# Patient Record
Sex: Male | Born: 1945 | Race: Black or African American | Hispanic: No | Marital: Married | Smoking: Never smoker
Health system: Southern US, Community
[De-identification: ages and names within clinical notes are randomized; demographics above are authoritative.]

## PROBLEM LIST (undated history)

## (undated) DIAGNOSIS — I10 Essential (primary) hypertension: Secondary | ICD-10-CM

## (undated) DIAGNOSIS — C801 Malignant (primary) neoplasm, unspecified: Secondary | ICD-10-CM

## (undated) HISTORY — PX: OTHER SURGICAL HISTORY: SHX169

## (undated) HISTORY — PX: LUNG CANCER SURGERY: SHX702

## (undated) HISTORY — PX: COLON SURGERY: SHX602

---

## 2010-01-13 ENCOUNTER — Ambulatory Visit: Admission: RE | Admit: 2010-01-13 | Discharge: 2010-03-31 | Payer: Self-pay | Admitting: Radiation Oncology

## 2010-01-21 ENCOUNTER — Encounter: Admission: RE | Admit: 2010-01-21 | Discharge: 2010-01-21 | Payer: Self-pay | Admitting: Urology

## 2010-03-01 ENCOUNTER — Ambulatory Visit (HOSPITAL_BASED_OUTPATIENT_CLINIC_OR_DEPARTMENT_OTHER): Admission: RE | Admit: 2010-03-01 | Discharge: 2010-03-01 | Payer: Self-pay | Admitting: Urology

## 2011-01-04 LAB — CBC
Hemoglobin: 15.7 g/dL (ref 13.0–17.0)
MCHC: 32.8 g/dL (ref 30.0–36.0)
MCV: 86.4 fL (ref 78.0–100.0)
Platelets: 255 10*3/uL (ref 150–400)
RBC: 5.53 MIL/uL (ref 4.22–5.81)
RDW: 13.1 % (ref 11.5–15.5)

## 2011-01-04 LAB — COMPREHENSIVE METABOLIC PANEL
ALT: 25 U/L (ref 0–53)
AST: 25 U/L (ref 0–37)
Alkaline Phosphatase: 52 U/L (ref 39–117)
CO2: 27 mEq/L (ref 19–32)
GFR calc non Af Amer: >60 mL/min (ref >60)
Glucose, Bld: 97 mg/dL (ref 70–99)
Total Bilirubin: 0.8 mg/dL (ref 0.3–1.2)
Total Protein: 7.3 g/dL (ref 6.0–8.3)

## 2011-01-04 LAB — PROTIME-INR: Prothrombin Time: 13 seconds (ref 11.6–15.2)

## 2013-02-21 ENCOUNTER — Encounter: Payer: Medicare Other | Admitting: Internal Medicine

## 2013-02-21 ENCOUNTER — Other Ambulatory Visit (HOSPITAL_COMMUNITY): Payer: Self-pay | Admitting: Internal Medicine

## 2013-02-21 DIAGNOSIS — C189 Malignant neoplasm of colon, unspecified: Secondary | ICD-10-CM

## 2013-02-21 DIAGNOSIS — C61 Malignant neoplasm of prostate: Secondary | ICD-10-CM

## 2013-02-21 DIAGNOSIS — E785 Hyperlipidemia, unspecified: Secondary | ICD-10-CM

## 2013-02-21 DIAGNOSIS — G609 Hereditary and idiopathic neuropathy, unspecified: Secondary | ICD-10-CM

## 2013-02-25 ENCOUNTER — Ambulatory Visit (HOSPITAL_COMMUNITY)
Admission: RE | Admit: 2013-02-25 | Discharge: 2013-02-25 | Disposition: A | Discharge status: Home / Self Care | Payer: Medicare Other | Source: Ambulatory Visit | Attending: Internal Medicine | Admitting: Internal Medicine

## 2013-02-25 DIAGNOSIS — C189 Malignant neoplasm of colon, unspecified: Secondary | ICD-10-CM | POA: Insufficient documentation

## 2013-02-25 DIAGNOSIS — Z9049 Acquired absence of other specified parts of digestive tract: Secondary | ICD-10-CM | POA: Insufficient documentation

## 2013-02-25 DIAGNOSIS — I2510 Atherosclerotic heart disease of native coronary artery without angina pectoris: Secondary | ICD-10-CM | POA: Insufficient documentation

## 2013-02-25 DIAGNOSIS — D1779 Benign lipomatous neoplasm of other sites: Secondary | ICD-10-CM | POA: Insufficient documentation

## 2013-02-25 DIAGNOSIS — K7689 Other specified diseases of liver: Secondary | ICD-10-CM | POA: Insufficient documentation

## 2013-02-25 DIAGNOSIS — E279 Disorder of adrenal gland, unspecified: Secondary | ICD-10-CM | POA: Insufficient documentation

## 2013-02-25 LAB — GLUCOSE, CAPILLARY: Glucose-Capillary: 98 mg/dL (ref 70–99)

## 2013-02-25 MED ORDER — FLUDEOXYGLUCOSE F - 18 (FDG) INJECTION
18.0000 | Freq: Once | INTRAVENOUS | Status: AC | PRN
  Administered 2013-02-25: 18 via INTRAVENOUS

## 2013-03-04 ENCOUNTER — Encounter: Payer: Medicare Other | Admitting: Internal Medicine

## 2013-03-04 DIAGNOSIS — C189 Malignant neoplasm of colon, unspecified: Secondary | ICD-10-CM

## 2013-03-04 DIAGNOSIS — C61 Malignant neoplasm of prostate: Secondary | ICD-10-CM

## 2013-09-18 ENCOUNTER — Other Ambulatory Visit (HOSPITAL_COMMUNITY): Payer: Self-pay | Admitting: Internal Medicine

## 2013-09-18 ENCOUNTER — Other Ambulatory Visit (HOSPITAL_COMMUNITY): Payer: Self-pay | Admitting: Hematology and Oncology

## 2013-09-18 DIAGNOSIS — C189 Malignant neoplasm of colon, unspecified: Secondary | ICD-10-CM

## 2013-09-18 DIAGNOSIS — K7689 Other specified diseases of liver: Secondary | ICD-10-CM

## 2013-09-18 DIAGNOSIS — K769 Liver disease, unspecified: Secondary | ICD-10-CM

## 2013-09-19 ENCOUNTER — Other Ambulatory Visit (HOSPITAL_COMMUNITY): Payer: Self-pay | Admitting: Hematology and Oncology

## 2013-09-19 DIAGNOSIS — K769 Liver disease, unspecified: Secondary | ICD-10-CM

## 2013-09-27 ENCOUNTER — Other Ambulatory Visit (HOSPITAL_COMMUNITY): Payer: Self-pay | Admitting: Hematology and Oncology

## 2013-09-27 DIAGNOSIS — R109 Unspecified abdominal pain: Secondary | ICD-10-CM

## 2013-10-02 ENCOUNTER — Encounter (HOSPITAL_COMMUNITY): Payer: Self-pay | Admitting: Pharmacy Technician

## 2013-10-04 ENCOUNTER — Other Ambulatory Visit (HOSPITAL_COMMUNITY): Payer: Self-pay | Admitting: Radiology

## 2013-10-07 ENCOUNTER — Ambulatory Visit (HOSPITAL_COMMUNITY)
Admission: RE | Admit: 2013-10-07 | Discharge: 2013-10-07 | Disposition: A | Discharge status: Home / Self Care | Payer: Medicare Other | Source: Ambulatory Visit | Attending: Hematology and Oncology | Admitting: Hematology and Oncology

## 2013-10-07 DIAGNOSIS — R109 Unspecified abdominal pain: Secondary | ICD-10-CM

## 2013-10-07 DIAGNOSIS — C189 Malignant neoplasm of colon, unspecified: Secondary | ICD-10-CM | POA: Insufficient documentation

## 2013-10-07 DIAGNOSIS — K7689 Other specified diseases of liver: Secondary | ICD-10-CM | POA: Insufficient documentation

## 2013-10-07 LAB — CBC
MCH: 29.2 pg (ref 26.0–34.0)
MCHC: 34.8 g/dL (ref 30.0–36.0)
MCV: 83.9 fL (ref 78.0–100.0)
Platelets: 247 10*3/uL (ref 150–400)

## 2013-10-07 LAB — PROTIME-INR: Prothrombin Time: 13.8 seconds (ref 11.6–15.2)

## 2013-10-07 MED ORDER — SODIUM CHLORIDE 0.9 % IV SOLN
Freq: Once | INTRAVENOUS | Status: AC
  Administered 2013-10-07: 13:00:00 via INTRAVENOUS

## 2013-10-07 MED ORDER — HYDROCODONE-ACETAMINOPHEN 5-325 MG PO TABS: 1.0000 | ORAL_TABLET | ORAL | Status: DC | PRN

## 2013-10-07 MED ORDER — MIDAZOLAM HCL 2 MG/2ML IJ SOLN
INTRAMUSCULAR | Status: AC | PRN
  Administered 2013-10-07: 2 mg via INTRAVENOUS

## 2013-10-07 MED ORDER — MIDAZOLAM HCL 2 MG/2ML IJ SOLN
INTRAMUSCULAR | Status: AC
  Filled 2013-10-07: qty 4

## 2013-10-07 MED ORDER — FENTANYL CITRATE 0.05 MG/ML IJ SOLN
INTRAMUSCULAR | Status: AC | PRN
  Administered 2013-10-07: 25 ug via INTRAVENOUS
  Administered 2013-10-07: 50 ug via INTRAVENOUS

## 2013-10-07 MED ORDER — FENTANYL CITRATE 0.05 MG/ML IJ SOLN
INTRAMUSCULAR | Status: AC
  Filled 2013-10-07: qty 4

## 2013-10-07 NOTE — H&P (Signed)
Andrew Mccann is an 67 y.o. male.   Chief Complaint: "I'm having another liver biopsy" HPI: Patient with history of prostate and colon carcinomas , elevated CEA and recent imaging studies revealing liver lesions with negative liver biopsy in [place] two weeks ago presents today for repeat US guided liver lesion biopsy .  PMH: colon ca, prostate ca, glaucoma, prior H pylori PSH: partial colectomy, prostate seed implant, eye surgery Social History: prior smoker, prior alcohol use, lives in [place], [place]., married, 3 children, worked as Education administrator, and in  Medical laboratory scientific officer                FH: positive for breast cancer in sisters Allergies: No Known Allergies  Current outpatient prescriptions:brimonidine (ALPHAGAN) 0.2 % ophthalmic solution, Place 1 drop into both eyes as directed. , Disp: , Rfl: ;  dorzolamide-timolol (COSOPT) 22.3-6.8 MG/ML ophthalmic solution, Place 1 drop into both eyes 2 (two) times daily., Disp: , Rfl: ;  loratadine (CLARITIN) 10 MG tablet, Take 10 mg by mouth daily., Disp: , Rfl: ;  lovastatin (MEVACOR) 20 MG tablet, Take 20 mg by mouth at bedtime., Disp: , Rfl:  Multiple Vitamin (MULTIVITAMIN WITH MINERALS) TABS tablet, Take 1 tablet by mouth daily., Disp: , Rfl: ;  omeprazole (PRILOSEC) 10 MG capsule, Take 10 mg by mouth daily., Disp: , Rfl:   10/07/13 labs pending  Review of Systems  Constitutional: Negative for fever, chills and weight loss.  Respiratory: Negative for cough and shortness of breath.   Cardiovascular: Negative for chest pain.  Gastrointestinal: Negative for nausea, vomiting, abdominal pain and blood in stool.  Genitourinary: Negative for hematuria.  Musculoskeletal: Negative for back pain.  Neurological: Negative for headaches.  Endo/Heme/Allergies: Does not bruise/bleed easily.  There were no vitals filed for this visit.   Physical Exam  Constitutional: He is oriented to person, place, and time. He appears well-developed and well-nourished.  Cardiovascular:  Normal rate and regular rhythm.   Respiratory: Effort normal and breath sounds normal.  GI: Soft. Bowel sounds are normal. He exhibits no distension. There is no tenderness.  Musculoskeletal: Normal range of motion. He exhibits no edema.  Neurological: He is alert and oriented to person, place, and time.     Assessment/Plan  Pt with hx of colon/prostate carcinoma, elevated CEA, liver lesions noted on recent imaging and negative liver bx in [place] 2 weeks ago. Plan is for repeat US guided liver lesion biopsy today. Details/risks of procedure d/w pt/family with their understanding and consent.  Jacorion Klem,D KEVIN 10/07/2013, 12:54 PM

## 2013-10-07 NOTE — Procedures (Signed)
[place] 18g core liver lesion bx x2 to surg path No complication No blood loss. See complete dictation in [facility].

## 2013-10-08 ENCOUNTER — Telehealth (HOSPITAL_COMMUNITY): Payer: Self-pay | Admitting: *Deleted

## 2014-01-13 ENCOUNTER — Other Ambulatory Visit (HOSPITAL_COMMUNITY): Payer: Self-pay | Admitting: Oncology

## 2014-01-13 ENCOUNTER — Ambulatory Visit (HOSPITAL_COMMUNITY)
Admission: RE | Admit: 2014-01-13 | Discharge: 2014-01-13 | Disposition: A | Discharge status: Home / Self Care | Payer: Medicare Other | Source: Ambulatory Visit | Attending: Oncology | Admitting: Oncology

## 2014-01-13 DIAGNOSIS — E279 Disorder of adrenal gland, unspecified: Secondary | ICD-10-CM | POA: Insufficient documentation

## 2014-01-13 DIAGNOSIS — Z85038 Personal history of other malignant neoplasm of large intestine: Secondary | ICD-10-CM | POA: Insufficient documentation

## 2014-01-13 DIAGNOSIS — Z8546 Personal history of malignant neoplasm of prostate: Secondary | ICD-10-CM | POA: Insufficient documentation

## 2014-01-13 DIAGNOSIS — C787 Secondary malignant neoplasm of liver and intrahepatic bile duct: Secondary | ICD-10-CM | POA: Insufficient documentation

## 2014-01-13 DIAGNOSIS — C189 Malignant neoplasm of colon, unspecified: Secondary | ICD-10-CM

## 2014-01-13 MED ORDER — IOHEXOL 300 MG/ML  SOLN: 25.0000 mL | INTRAMUSCULAR | Status: AC

## 2014-01-13 MED ORDER — IOHEXOL 300 MG/ML  SOLN
100.0000 mL | Freq: Once | INTRAMUSCULAR | Status: AC | PRN
  Administered 2014-01-13: 100 mL via INTRAVENOUS

## 2014-12-22 IMAGING — PT NM PET TUM IMG INITIAL (PI) SKULL BASE T - THIGH
6 series · 25 of 25 positions shown · non-contrast
Comparison: CT abdomen pelvis 01/29/2013.  No prior PET.  Chest
film of 01/29/2013 is reviewed.

CLINICAL DATA: Initial treatment strategy for staging of colon
cancer..

NUCLEAR MEDICINE PET SKULL BASE TO THIGH
Fasting Blood Glucose:  98
TECHNIQUE: 18.0 mCi F-18 FDG was injected intravenously. CT data
was obtained and used for attenuation correction and anatomic
localization only.  (This was not acquired as a diagnostic CT
examination.) Additional exam technical data entered on
technologist worksheet.

[Series 1: pet ac · axial · 3.3mm · 4.69mm/px · z∈[-913,-43]mm · 5 of 267 slices shown]
[im 1/267]
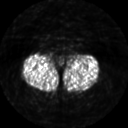
[im 67/267]
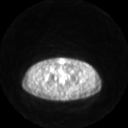
[im 134/267]
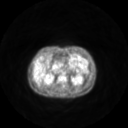
[im 200/267]
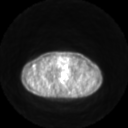
[im 267/267]
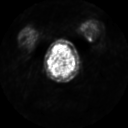

[Series 2: ct images · axial · 3.8mm · 0.98mm/px · z∈[-913,-43]mm · 6 of 267 slices shown]
[im 1/267]
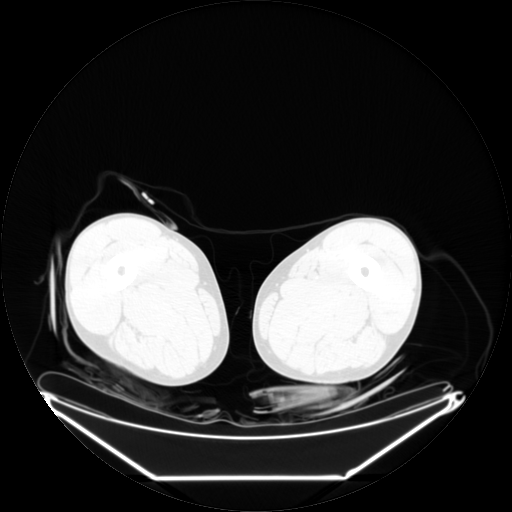
[im 54/267]
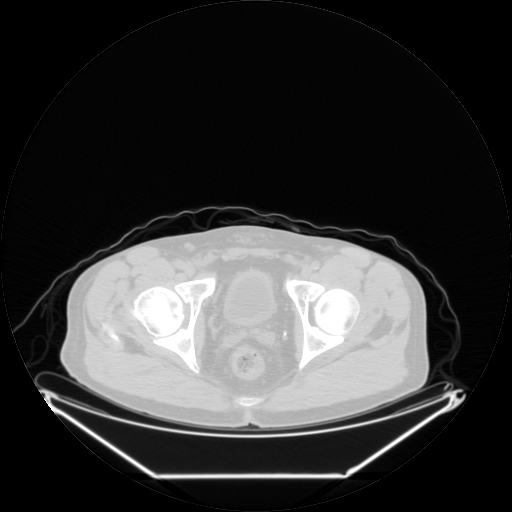
[im 107/267]
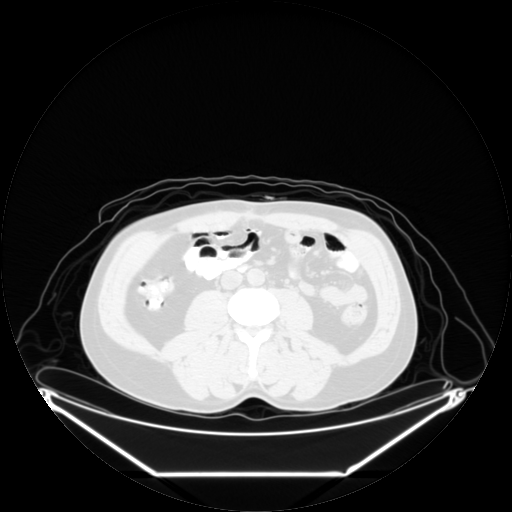
[im 160/267]
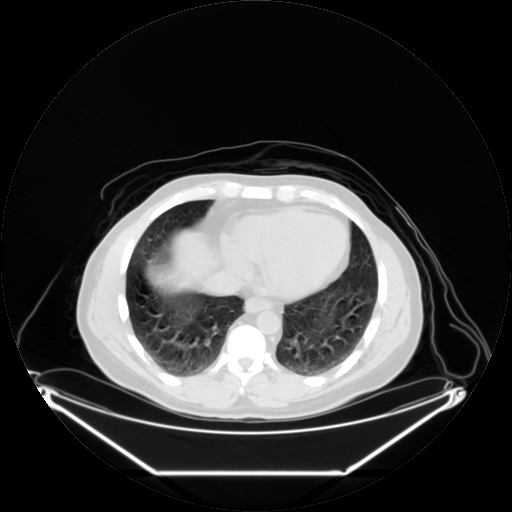
[im 213/267]
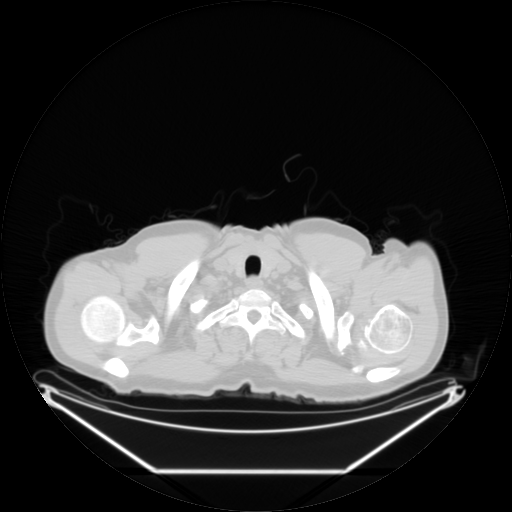
[im 267/267  brain]
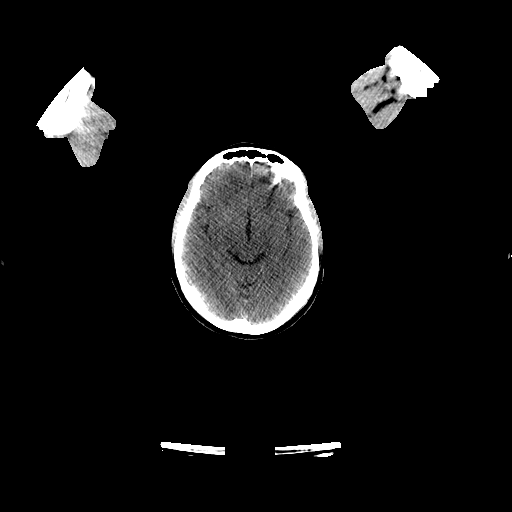

[Series 2: pet nac · axial · 3.3mm · 4.69mm/px · z∈[-913,-43]mm · 6 of 267 slices shown]
[im 1/267]
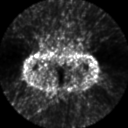
[im 54/267]
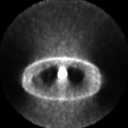
[im 107/267]
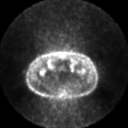
[im 160/267]
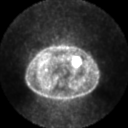
[im 213/267]
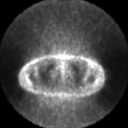
[im 267/267]
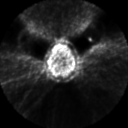

[Series 123: mip · coronal · 3.3mm · 4.69mm/px · 1 of 30 slices shown]
[im 1/30]
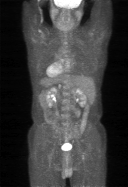

[Series 151: reformatted · axial · 3.3mm · 3.91mm/px · z∈[-913,-43]mm · 6 of 265 slices shown (1 of 2)]
[im 1/265]
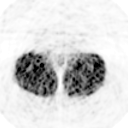
[im 53/265]
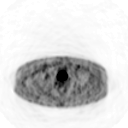
[im 106/265]
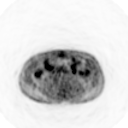
[im 159/265]
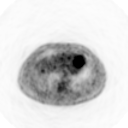
[im 212/265]
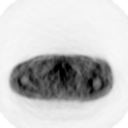
[im 265/265]
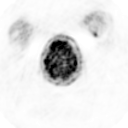

[Series 153: reformatted · coronal · 4.7mm · 6.98mm/px · 1 of 60 slices shown (2 of 2)]
[im 1/60]
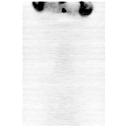

[25 of 25 positions shown; findings below may reference images not displayed]

FINDINGS: Neck: No abnormal hypermetabolism.

Chest:  No abnormal hypermetabolism.  .  There is hypermetabolism
within the left upper extremity and subclavian region secondary to
presumed site of injection.

Abdomen/Pelvis:  A focus of hypermetabolism which corresponds to a
left adrenal nodule.  This measures 2.0 cm and less than 6 HU on
unenhanced CT.  This measures a S.U.V. max of 5.1.

Low level hypermetabolism about the anterior pelvic wall,
consistent with interval laparotomy.  There has been interval
sigmoid resection with low level hypermetabolism at the surgical
site.  No surrounding hypermetabolic nodes.

Skeleton:  A focus of hypermetabolism within the posterior aspect
of the T3 vertebral body.  No CT correlate.

CT  images performed for attenuation correction demonstrate no
significant findings within the neck.

LAD coronary artery atherosclerosis on image 90/series 2.  Right
posterolateral intercostal lipoma.  Scattered areas of subpleural
nodularity, including within the left upper lobe. Likely subpleural
lymph nodes.

Low density liver lesions, as detailed on prior exam.  No correlate
hypermetabolism.  Radiation seeds in the prostate.  Minimal air
along the cephalad aspect of the sigmoid colon, just proximal
surgical site.  Example image 199/series 2.  No surrounding fluid
collection.
IMPRESSION: 1.  Interval resection of sigmoid primary.  No evidence of
residual/recurrent disease.
2.  Hypermetabolic left adrenal nodule.  Given concurrent low
density on CT, this is most consistent with an adenoma.
3.  Low density liver lesions which are without correlate
hypermetabolism.  Given their small size, these warrant ongoing
attention to exclude unlikely metastatic disease.
4.  Coronary artery atherosclerosis.
5.  Focus of hypermetabolism within the T3 vertebral body which is
favored to be physiologic. Recommend attention on follow-up.
6.  Subtle extraluminal gas along the cephalad aspect of the
sigmoid colon.  No surrounding fluid collection.  Favor isolated
diverticulum or postoperative change.

## 2016-09-22 ENCOUNTER — Encounter (HOSPITAL_COMMUNITY): Payer: Self-pay

## 2021-03-20 ENCOUNTER — Emergency Department (HOSPITAL_COMMUNITY)
Admission: EM | Admit: 2021-03-20 | Discharge: 2021-03-20 | Disposition: A | Discharge status: Home / Self Care | Payer: Medicare Other | Attending: Emergency Medicine | Admitting: Emergency Medicine

## 2021-03-20 ENCOUNTER — Encounter (HOSPITAL_COMMUNITY): Payer: Self-pay | Admitting: Emergency Medicine

## 2021-03-20 ENCOUNTER — Other Ambulatory Visit: Payer: Self-pay

## 2021-03-20 DIAGNOSIS — R300 Dysuria: Secondary | ICD-10-CM | POA: Insufficient documentation

## 2021-03-20 DIAGNOSIS — R1030 Lower abdominal pain, unspecified: Secondary | ICD-10-CM | POA: Insufficient documentation

## 2021-03-20 DIAGNOSIS — Z8546 Personal history of malignant neoplasm of prostate: Secondary | ICD-10-CM | POA: Insufficient documentation

## 2021-03-20 DIAGNOSIS — R5381 Other malaise: Secondary | ICD-10-CM | POA: Diagnosis not present

## 2021-03-20 DIAGNOSIS — R509 Fever, unspecified: Secondary | ICD-10-CM | POA: Insufficient documentation

## 2021-03-20 DIAGNOSIS — I10 Essential (primary) hypertension: Secondary | ICD-10-CM | POA: Diagnosis not present

## 2021-03-20 DIAGNOSIS — Z79899 Other long term (current) drug therapy: Secondary | ICD-10-CM | POA: Insufficient documentation

## 2021-03-20 DIAGNOSIS — Z20822 Contact with and (suspected) exposure to covid-19: Secondary | ICD-10-CM | POA: Diagnosis not present

## 2021-03-20 HISTORY — DX: Malignant (primary) neoplasm, unspecified: C80.1

## 2021-03-20 HISTORY — DX: Essential (primary) hypertension: I10

## 2021-03-20 LAB — URINALYSIS, ROUTINE W REFLEX MICROSCOPIC
Bilirubin Urine: NEGATIVE
Glucose, UA: NEGATIVE mg/dL
Hgb urine dipstick: NEGATIVE
Ketones, ur: NEGATIVE mg/dL
Leukocytes,Ua: NEGATIVE
Nitrite: NEGATIVE
Protein, ur: NEGATIVE mg/dL
Specific Gravity, Urine: 1.011 (ref 1.005–1.030)
pH: 7 (ref 5.0–8.0)

## 2021-03-20 LAB — CBC WITH DIFFERENTIAL/PLATELET
Abs Immature Granulocytes: 0.01 10*3/uL (ref 0.00–0.07)
Basophils Absolute: 0 10*3/uL (ref 0.0–0.1)
Basophils Relative: 0 %
Eosinophils Absolute: 0.1 10*3/uL (ref 0.0–0.5)
Eosinophils Relative: 1 %
HCT: 49.3 % (ref 39.0–52.0)
Hemoglobin: 15.9 g/dL (ref 13.0–17.0)
Immature Granulocytes: 0 %
Lymphocytes Relative: 18 %
Lymphs Abs: 1.1 10*3/uL (ref 0.7–4.0)
MCH: 28.9 pg (ref 26.0–34.0)
MCHC: 32.3 g/dL (ref 30.0–36.0)
MCV: 89.6 fL (ref 80.0–100.0)
Monocytes Absolute: 0.9 10*3/uL (ref 0.1–1.0)
Monocytes Relative: 14 %
Neutro Abs: 4 10*3/uL (ref 1.7–7.7)
Neutrophils Relative %: 67 %
Platelets: 139 10*3/uL — ABNORMAL LOW (ref 150–400)
RBC: 5.5 MIL/uL (ref 4.22–5.81)
RDW: 12.8 % (ref 11.5–15.5)
WBC: 6 10*3/uL (ref 4.0–10.5)
nRBC: 0 % (ref 0.0–0.2)

## 2021-03-20 LAB — RESP PANEL BY RT-PCR (FLU A&B, COVID) ARPGX2
Influenza A by PCR: NEGATIVE
Influenza B by PCR: NEGATIVE
SARS Coronavirus 2 by RT PCR: NEGATIVE

## 2021-03-20 LAB — COMPREHENSIVE METABOLIC PANEL
ALT: 22 U/L (ref 0–44)
AST: 28 U/L (ref 15–41)
Albumin: 4.4 g/dL (ref 3.5–5.0)
Alkaline Phosphatase: 74 U/L (ref 38–126)
Anion gap: 7 (ref 5–15)
BUN: 20 mg/dL (ref 8–23)
CO2: 24 mmol/L (ref 22–32)
Calcium: 8.9 mg/dL (ref 8.9–10.3)
Chloride: 105 mmol/L (ref 98–111)
Creatinine, Ser: 1.11 mg/dL (ref 0.61–1.24)
GFR, Estimated: >60 mL/min (ref >60)
Glucose, Bld: 75 mg/dL (ref 70–99)
Potassium: 3.9 mmol/L (ref 3.5–5.1)
Sodium: 136 mmol/L (ref 135–145)
Total Bilirubin: 0.5 mg/dL (ref 0.3–1.2)
Total Protein: 7.3 g/dL (ref 6.5–8.1)

## 2021-03-20 MED ORDER — ACETAMINOPHEN 500 MG PO TABS
1000.0000 mg | ORAL_TABLET | Freq: Once | ORAL | Status: AC
  Administered 2021-03-20: 1000 mg via ORAL
  Filled 2021-03-20: qty 2

## 2021-03-20 MED ORDER — CIPROFLOXACIN HCL 500 MG PO TABS: 500.0000 mg | ORAL_TABLET | Freq: Two times a day (BID) | ORAL | 0 refills | Status: DC

## 2021-03-20 MED ORDER — CIPROFLOXACIN HCL 250 MG PO TABS
500.0000 mg | ORAL_TABLET | Freq: Once | ORAL | Status: AC
  Administered 2021-03-20: 500 mg via ORAL
  Filled 2021-03-20: qty 2

## 2021-03-20 MED ORDER — CIPROFLOXACIN HCL 500 MG PO TABS: 500.0000 mg | ORAL_TABLET | Freq: Two times a day (BID) | ORAL | 0 refills | Status: AC

## 2021-03-20 NOTE — Discharge Instructions (Addendum)
You are being treated for a urinary tract infection tonight with your symptoms and fever.  Your other labs are negative (no Covid, and normal blood work).  Plan to see your primary MD within the next week for a recheck of your symptoms.

## 2021-03-20 NOTE — ED Provider Notes (Signed)
[facility] EMERGENCY DEPARTMENT Provider Note   CSN: [id number] Arrival date & time: 03/20/21  1703     History Chief Complaint  Patient presents with  . Abdominal Pain    Andrew Mccann is a 75 y.o. male with past medical history including hypertension and cancer including prostate, colon, liver and lung which was surgically treated and now quiescent presenting with chills subjective fever, slight burning sensation with urination along with lower abdominal discomfort and generalized malaise.  He denies hematuria but does have increased urinary frequency.  He does not have a history of kidney stones, also denies penile discharge and states he is not sexually active.  He has had no recent exposures to anybody with similar illness, denies cough, shortness of breath, sore throat, headache or neck pain.  No recent travel.  He has taken Tylenol for fever but it continues to come back.  HPI     Past Medical History:  Diagnosis Date  . Cancer [facility])    prostate, colon, liver, lung  . Hypertension     There are no problems to display for this patient.   Past Surgical History:  Procedure Laterality Date  . COLON SURGERY    . liver cancer surgery    . LUNG CANCER SURGERY         No family history on file.  Social History   Tobacco Use  . Smoking status: Never Smoker  . Smokeless tobacco: Never Used    Home Medications Prior to Admission medications   Medication Sig Start Date End Date Taking? Authorizing Provider  brimonidine (ALPHAGAN) 0.2 % ophthalmic solution Place 1 drop into both eyes as directed.     [provider]  ciprofloxacin (CIPRO) 500 MG tablet Take 1 tablet (500 mg total) by mouth every 12 (twelve) hours. 03/20/21   Evalee Jefferson, PA-C  dorzolamide-timolol (COSOPT) 22.3-6.8 MG/ML ophthalmic solution Place 1 drop into both eyes 2 (two) times daily.    [provider]  loratadine (CLARITIN) 10 MG tablet Take 10 mg by mouth daily.    [provider]  lovastatin (MEVACOR) 20 MG tablet Take 20 mg by mouth at bedtime.    [provider]  Multiple Vitamin (MULTIVITAMIN WITH MINERALS) TABS tablet Take 1 tablet by mouth daily.    [provider]  omeprazole (PRILOSEC) 10 MG capsule Take 10 mg by mouth daily.    [provider]    Allergies    Patient has no known allergies.  Review of Systems   Review of Systems  Constitutional: Positive for chills, fatigue and fever.  HENT: Negative for congestion and sore throat.   Eyes: Negative.   Respiratory: Negative for chest tightness and shortness of breath.   Cardiovascular: Negative for chest pain.  Gastrointestinal: Positive for nausea. Negative for abdominal pain and vomiting.  Genitourinary: Positive for dysuria. Negative for hematuria and penile discharge.  Musculoskeletal: Negative for arthralgias, joint swelling and neck pain.  Skin: Negative.  Negative for rash and wound.  Neurological: Negative for dizziness, weakness, light-headedness, numbness and headaches.  Psychiatric/Behavioral: Negative.     Physical Exam Updated Vital Signs BP (!) 143/82   Pulse 85   Temp 98.5 F (36.9 C) (Oral)   Resp 16   Ht 5\' 6"  (1.676 m)   Wt 77.1 kg   SpO2 95%   BMI 27.44 kg/m   Physical Exam Vitals and nursing note reviewed. Exam conducted with a chaperone present.  Constitutional:  Appearance: He is well-developed.  HENT:     Head: Normocephalic and atraumatic.  Eyes:     Conjunctiva/sclera: Conjunctivae normal.  Cardiovascular:     Rate and Rhythm: Normal rate and regular rhythm.     Heart sounds: Normal heart sounds.  Pulmonary:     Effort: Pulmonary effort is normal.     Breath sounds: Normal breath sounds. No wheezing.  Abdominal:     General: Bowel sounds are normal.     Palpations: Abdomen is soft.     Tenderness: There is abdominal tenderness in the suprapubic area. There is no guarding or rebound.     Comments: Mild  discomfort suprapubic region, no guarding, abd is soft.  Genitourinary:    Prostate: Normal. Not enlarged and not tender.  Musculoskeletal:        General: Normal range of motion.     Cervical back: Normal range of motion.  Skin:    General: Skin is warm and dry.  Neurological:     Mental Status: He is alert.     ED Results / Procedures / Treatments   Labs (all labs ordered are listed, but only abnormal results are displayed) Labs Reviewed  URINALYSIS, ROUTINE W REFLEX MICROSCOPIC - Abnormal; Notable for the following components:      Result Value   Color, Urine STRAW (*)    All other components within normal limits  CBC WITH DIFFERENTIAL/PLATELET - Abnormal; Notable for the following components:   Platelets 139 (*)    All other components within normal limits  RESP PANEL BY RT-PCR (FLU A&B, COVID) ARPGX2  URINE CULTURE  COMPREHENSIVE METABOLIC PANEL    EKG None  Radiology No results found.  Procedures Procedures   Medications Ordered in ED Medications  acetaminophen (TYLENOL) tablet 1,000 mg (1,000 mg Oral Given 03/20/21 2130)  ciprofloxacin (CIPRO) tablet 500 mg (500 mg Oral Given 03/20/21 2156)    ED Course  I have reviewed the triage vital signs and the nursing notes.  Pertinent labs & imaging results that were available during my care of the patient were reviewed by me and considered in my medical decision making (see chart for details).    MDM Rules/Calculators/A&P                          Pt with dysuria and intermittent fever, spiked a small fever to 100.5 while here.  Labs obtained with no uti on UA, culture sent.  Cbc reassuring with normal wbc count, cmet normal.  abd benign, no acute abd findings.  Given dysuria will cover for possibility of prostatitis - pt aware he may need additional abx, and will need to f/u with pcp. Prescribed cipro 14 days tx.  He sees his cancer specialist in 10 days in [place].   Discussed with Dr. Roderic Palau prior to dc home.  Final  Clinical Impression(s) / ED Diagnoses Final diagnoses:  Dysuria  Febrile illness    Rx / DC Orders ED Discharge Orders         Ordered    ciprofloxacin (CIPRO) 500 MG tablet  Every 12 hours,   Status:  Discontinued        03/20/21 2146    ciprofloxacin (CIPRO) 500 MG tablet  Every 12 hours        03/20/21 2159           Evalee Jefferson, Hershal Coria 03/20/21 2321    Milton Ferguson, MD 03/21/21 2320

## 2021-03-20 NOTE — ED Triage Notes (Signed)
Pt c/o burning with urination, some lower abd pain, and just not feeling well.

## 2021-03-22 LAB — URINE CULTURE: Culture: NO GROWTH

## 2022-08-01 ENCOUNTER — Encounter (HOSPITAL_COMMUNITY): Payer: Self-pay

## 2022-08-01 ENCOUNTER — Other Ambulatory Visit: Payer: Self-pay

## 2022-08-01 ENCOUNTER — Emergency Department (HOSPITAL_COMMUNITY)
Admission: EM | Admit: 2022-08-01 | Discharge: 2022-08-01 | Disposition: A | Discharge status: Home / Self Care | Payer: Medicare Other | Attending: Emergency Medicine | Admitting: Emergency Medicine

## 2022-08-01 DIAGNOSIS — Z79899 Other long term (current) drug therapy: Secondary | ICD-10-CM | POA: Insufficient documentation

## 2022-08-01 DIAGNOSIS — M79651 Pain in right thigh: Secondary | ICD-10-CM | POA: Diagnosis present

## 2022-08-01 DIAGNOSIS — Z8546 Personal history of malignant neoplasm of prostate: Secondary | ICD-10-CM | POA: Diagnosis not present

## 2022-08-01 DIAGNOSIS — I10 Essential (primary) hypertension: Secondary | ICD-10-CM | POA: Insufficient documentation

## 2022-08-01 MED ORDER — ENOXAPARIN SODIUM 120 MG/0.8ML IJ SOSY
1.5000 mg/kg | PREFILLED_SYRINGE | Freq: Once | INTRAMUSCULAR | Status: AC
  Administered 2022-08-01: 111 mg via SUBCUTANEOUS
  Filled 2022-08-01: qty 0.8

## 2022-08-01 NOTE — Discharge Instructions (Addendum)
Follow-up as instructed for the ultrasound of the leg.

## 2022-08-01 NOTE — ED Triage Notes (Signed)
Pt arrived from home via POV w c/o Right upper leg pain. 3/10 on pain scale while resting  8/10 when driving. Pt states that aprrox 10 months ago he had a DVT in the same area was on xarelto for those 6 months and has been off the xarelto for approx 4 months now.

## 2022-08-01 NOTE — ED Provider Notes (Signed)
[name]   CSN: [id number] Arrival date & time: 08/01/22  1817     History  Chief Complaint  Patient presents with   right leg pain    Andrew Mccann is a 76 y.o. male.  HPI Patient presents with pain in the right thigh.  Had a previous DVT in October.  Had been on Xarelto for 6 months and been off it for around 4 months now.  States now pain is gotten worse particular with driving a car.  Dull.  No real increase in swelling in the leg.  States it is not gone back to normal however.  No chest pain.  No trouble breathing.  Not on blood thinners now.  No injury.   Past Medical History:  Diagnosis Date   Cancer [facility])    prostate, colon, liver, lung   Hypertension     Home Medications Prior to Admission medications   Medication Sig Start Date End Date Taking? Authorizing Provider  brimonidine (ALPHAGAN) 0.2 % ophthalmic solution Place 1 drop into both eyes as directed.     [provider]  ciprofloxacin (CIPRO) 500 MG tablet Take 1 tablet (500 mg total) by mouth every 12 (twelve) hours. 03/20/21   Evalee Jefferson, PA-C  dorzolamide-timolol (COSOPT) 22.3-6.8 MG/ML ophthalmic solution Place 1 drop into both eyes 2 (two) times daily.    [provider]  loratadine (CLARITIN) 10 MG tablet Take 10 mg by mouth daily.    [provider]  lovastatin (MEVACOR) 20 MG tablet Take 20 mg by mouth at bedtime.    [provider]  Multiple Vitamin (MULTIVITAMIN WITH MINERALS) TABS tablet Take 1 tablet by mouth daily.    [provider]  omeprazole (PRILOSEC) 10 MG capsule Take 10 mg by mouth daily.    [provider]      Allergies    Patient has no known allergies.    Review of Systems   Review of Systems  Physical Exam Updated Vital Signs BP 133/69 (BP Location: Right Arm)   Pulse 70   Temp 97.6 F (36.4 C) (Oral)   Resp 17   SpO2 95%  Physical Exam Vitals and nursing note reviewed.  Abdominal:      Palpations: Abdomen is soft.  Musculoskeletal:        General: No tenderness.     Cervical back: Neck supple.  Skin:    Capillary Refill: Capillary refill takes less than 2 seconds.  Neurological:     Mental Status: He is alert and oriented to person, place, and time.  Psychiatric:        Mood and Affect: Mood normal.     ED Results / Procedures / Treatments   Labs (all labs ordered are listed, but only abnormal results are displayed) Labs Reviewed - No data to display  EKG None  Radiology No results found.  Procedures Procedures    Medications Ordered in ED Medications  enoxaparin (LOVENOX) 100 mg/mL injection 1.5 mg/kg (has no administration in time range)    ED Course/ Medical Decision Making/ A&P                           Medical Decision Making Risk Prescription drug management.   Patient with pain in his right lower thigh.  At site of previous DVT.  Had previously been on anticoagulation.  Now somewhat pain.  Worse with driving.  No chest pain or trouble  breathing that would make me think he has pulmonary embolism.  Will give Xarelto shot and have follow-up for outpatient Doppler since do not have ultrasound available at this time.  Has same temperature between both his legs so doubt acute arterial occlusion.  Minimal edema.  Appears stable for discharge home.        Final Clinical Impression(s) / ED Diagnoses Final diagnoses:  Pain of right thigh    Rx / DC Orders ED Discharge Orders          Ordered    US Venous Img Lower Bilateral        08/01/22 2004              [name], MD 08/01/22 2016
